# Patient Record
Sex: Female | Born: 1985 | Race: Black or African American | Hispanic: No | Marital: Single | Smoking: Never smoker
Health system: Southern US, Community
[De-identification: ages and names within clinical notes are randomized; demographics above are authoritative.]

## PROBLEM LIST (undated history)

## (undated) DIAGNOSIS — T300 Burn of unspecified body region, unspecified degree: Secondary | ICD-10-CM

## (undated) DIAGNOSIS — J45909 Unspecified asthma, uncomplicated: Secondary | ICD-10-CM

## (undated) DIAGNOSIS — G8929 Other chronic pain: Secondary | ICD-10-CM

## (undated) DIAGNOSIS — W3400XA Accidental discharge from unspecified firearms or gun, initial encounter: Secondary | ICD-10-CM

## (undated) HISTORY — PX: OTHER SURGICAL HISTORY: SHX169

---

## 2007-04-18 ENCOUNTER — Emergency Department (HOSPITAL_COMMUNITY): Admission: EM | Admit: 2007-04-18 | Discharge: 2007-04-18 | Payer: Self-pay | Admitting: Emergency Medicine

## 2014-05-14 ENCOUNTER — Emergency Department (HOSPITAL_COMMUNITY)
Admission: EM | Admit: 2014-05-14 | Discharge: 2014-05-15 | Disposition: A | Discharge status: Home / Self Care | Attending: Emergency Medicine | Admitting: Emergency Medicine

## 2014-05-14 ENCOUNTER — Encounter (HOSPITAL_COMMUNITY): Payer: Self-pay | Admitting: Emergency Medicine

## 2014-05-14 DIAGNOSIS — Z87828 Personal history of other (healed) physical injury and trauma: Secondary | ICD-10-CM | POA: Insufficient documentation

## 2014-05-14 DIAGNOSIS — A499 Bacterial infection, unspecified: Secondary | ICD-10-CM | POA: Insufficient documentation

## 2014-05-14 DIAGNOSIS — M545 Low back pain, unspecified: Secondary | ICD-10-CM | POA: Insufficient documentation

## 2014-05-14 DIAGNOSIS — Z791 Long term (current) use of non-steroidal anti-inflammatories (NSAID): Secondary | ICD-10-CM | POA: Insufficient documentation

## 2014-05-14 DIAGNOSIS — J45909 Unspecified asthma, uncomplicated: Secondary | ICD-10-CM | POA: Insufficient documentation

## 2014-05-14 DIAGNOSIS — N760 Acute vaginitis: Secondary | ICD-10-CM | POA: Insufficient documentation

## 2014-05-14 DIAGNOSIS — G8929 Other chronic pain: Secondary | ICD-10-CM | POA: Insufficient documentation

## 2014-05-14 DIAGNOSIS — B9689 Other specified bacterial agents as the cause of diseases classified elsewhere: Secondary | ICD-10-CM | POA: Insufficient documentation

## 2014-05-14 DIAGNOSIS — Z3202 Encounter for pregnancy test, result negative: Secondary | ICD-10-CM | POA: Insufficient documentation

## 2014-05-14 DIAGNOSIS — R1110 Vomiting, unspecified: Secondary | ICD-10-CM | POA: Insufficient documentation

## 2014-05-14 DIAGNOSIS — Z7982 Long term (current) use of aspirin: Secondary | ICD-10-CM | POA: Insufficient documentation

## 2014-05-14 HISTORY — DX: Burn of unspecified body region, unspecified degree: T30.0

## 2014-05-14 HISTORY — DX: Accidental discharge from unspecified firearms or gun, initial encounter: W34.00XA

## 2014-05-14 HISTORY — DX: Unspecified asthma, uncomplicated: J45.909

## 2014-05-14 HISTORY — DX: Other chronic pain: G89.29

## 2014-05-14 LAB — CBC WITH DIFFERENTIAL/PLATELET
BASOS ABS: 0 10*3/uL (ref 0.0–0.1)
Basophils Relative: 0 % (ref 0–1)
EOS PCT: 4 % (ref 0–5)
Eosinophils Absolute: 0.2 10*3/uL (ref 0.0–0.7)
HEMATOCRIT: 33.5 % — AB (ref 36.0–46.0)
HEMOGLOBIN: 11.3 g/dL — AB (ref 12.0–15.0)
LYMPHS ABS: 2.5 10*3/uL (ref 0.7–4.0)
LYMPHS PCT: 46 % (ref 12–46)
MCH: 28.6 pg (ref 26.0–34.0)
MCHC: 33.7 g/dL (ref 30.0–36.0)
MCV: 84.8 fL (ref 78.0–100.0)
MONO ABS: 0.3 10*3/uL (ref 0.1–1.0)
MONOS PCT: 6 % (ref 3–12)
Neutro Abs: 2.4 10*3/uL (ref 1.7–7.7)
Neutrophils Relative %: 44 % (ref 43–77)
Platelets: 250 10*3/uL (ref 150–400)
RBC: 3.95 MIL/uL (ref 3.87–5.11)
RDW: 14.1 % (ref 11.5–15.5)
WBC: 5.6 10*3/uL (ref 4.0–10.5)

## 2014-05-14 LAB — URINALYSIS, ROUTINE W REFLEX MICROSCOPIC
Bilirubin Urine: NEGATIVE
Glucose, UA: NEGATIVE mg/dL
Hgb urine dipstick: NEGATIVE
KETONES UR: NEGATIVE mg/dL
Nitrite: NEGATIVE
PROTEIN: NEGATIVE mg/dL
Specific Gravity, Urine: 1.016 (ref 1.005–1.030)
UROBILINOGEN UA: 1 mg/dL (ref 0.0–1.0)
pH: 6 (ref 5.0–8.0)

## 2014-05-14 LAB — COMPREHENSIVE METABOLIC PANEL
ALT: 12 U/L (ref 0–35)
AST: 18 U/L (ref 0–37)
Albumin: 3.4 g/dL — ABNORMAL LOW (ref 3.5–5.2)
Alkaline Phosphatase: 55 U/L (ref 39–117)
BUN: 8 mg/dL (ref 6–23)
CALCIUM: 8.6 mg/dL (ref 8.4–10.5)
CO2: 25 meq/L (ref 19–32)
CREATININE: 0.74 mg/dL (ref 0.50–1.10)
Chloride: 104 mEq/L (ref 96–112)
GLUCOSE: 112 mg/dL — AB (ref 70–99)
Potassium: 3.5 mEq/L — ABNORMAL LOW (ref 3.7–5.3)
Sodium: 140 mEq/L (ref 137–147)
Total Protein: 7.3 g/dL (ref 6.0–8.3)

## 2014-05-14 LAB — URINE MICROSCOPIC-ADD ON

## 2014-05-14 LAB — POC URINE PREG, ED: Preg Test, Ur: NEGATIVE

## 2014-05-14 MED ORDER — OXYCODONE-ACETAMINOPHEN 5-325 MG PO TABS
2.0000 | ORAL_TABLET | Freq: Once | ORAL | Status: AC
  Administered 2014-05-14: 2 via ORAL
  Filled 2014-05-14: qty 2

## 2014-05-14 MED ORDER — ONDANSETRON HCL 4 MG/2ML IJ SOLN
4.0000 mg | Freq: Once | INTRAMUSCULAR | Status: AC
  Administered 2014-05-14: 4 mg via INTRAVENOUS
  Filled 2014-05-14: qty 2

## 2014-05-14 NOTE — ED Notes (Signed)
POCT PREG RESULTED NEG.
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
## 2014-05-15 ENCOUNTER — Encounter (HOSPITAL_COMMUNITY): Payer: Self-pay | Admitting: Emergency Medicine

## 2014-05-15 LAB — WET PREP, GENITAL
TRICH WET PREP: NONE SEEN
Yeast Wet Prep HPF POC: NONE SEEN

## 2014-05-15 LAB — GC/CHLAMYDIA PROBE AMP
CT PROBE, AMP APTIMA: NEGATIVE
GC PROBE AMP APTIMA: NEGATIVE

## 2014-05-15 MED ORDER — ONDANSETRON HCL 4 MG PO TABS: 4.0000 mg | ORAL_TABLET | Freq: Four times a day (QID) | ORAL | Status: AC

## 2014-05-15 MED ORDER — METRONIDAZOLE 500 MG PO TABS
2000.0000 mg | ORAL_TABLET | Freq: Once | ORAL | Status: AC
  Administered 2014-05-15: 2000 mg via ORAL
  Filled 2014-05-15: qty 4

## 2014-05-15 MED ORDER — AZITHROMYCIN 250 MG PO TABS
1000.0000 mg | ORAL_TABLET | Freq: Once | ORAL | Status: AC
  Administered 2014-05-15: 1000 mg via ORAL
  Filled 2014-05-15: qty 4

## 2014-05-15 MED ORDER — CEFTRIAXONE SODIUM 250 MG IJ SOLR
250.0000 mg | Freq: Once | INTRAMUSCULAR | Status: AC
  Administered 2014-05-15: 250 mg via INTRAMUSCULAR
  Filled 2014-05-15: qty 250

## 2014-05-15 MED ORDER — NAPROXEN 375 MG PO TABS: 375.0000 mg | ORAL_TABLET | Freq: Two times a day (BID) | ORAL | Status: AC

## 2014-05-15 MED ORDER — CYCLOBENZAPRINE HCL 10 MG PO TABS: 10.0000 mg | ORAL_TABLET | Freq: Two times a day (BID) | ORAL | Status: AC | PRN

## 2014-05-15 MED ORDER — ONDANSETRON 4 MG PO TBDP
4.0000 mg | ORAL_TABLET | Freq: Once | ORAL | Status: AC
  Administered 2014-05-15: 4 mg via ORAL
  Filled 2014-05-15: qty 1

## 2014-05-15 NOTE — Discharge Instructions (Signed)

## 2014-05-17 NOTE — ED Provider Notes (Signed)
Medical screening examination/treatment/procedure(s) were conducted as a shared visit with non-physician practitioner(s) and myself.  I personally evaluated the patient during the encounter.   Pt c/o low back pain. Constant. Dull. Non radiating. Spine nt. No cva tenderness.   Suzi Roots, MD 05/17/14 [phone]

## 2017-08-28 ENCOUNTER — Emergency Department (HOSPITAL_COMMUNITY)
Admission: EM | Admit: 2017-08-28 | Discharge: 2017-08-28 | Disposition: A | Discharge status: Home / Self Care | Attending: Emergency Medicine | Admitting: Emergency Medicine

## 2017-08-28 DIAGNOSIS — Z79899 Other long term (current) drug therapy: Secondary | ICD-10-CM | POA: Insufficient documentation

## 2017-08-28 DIAGNOSIS — Z791 Long term (current) use of non-steroidal anti-inflammatories (NSAID): Secondary | ICD-10-CM | POA: Insufficient documentation

## 2017-08-28 DIAGNOSIS — J45909 Unspecified asthma, uncomplicated: Secondary | ICD-10-CM | POA: Insufficient documentation

## 2017-08-28 DIAGNOSIS — R110 Nausea: Secondary | ICD-10-CM

## 2017-08-28 DIAGNOSIS — R112 Nausea with vomiting, unspecified: Secondary | ICD-10-CM | POA: Insufficient documentation

## 2017-08-28 LAB — COMPREHENSIVE METABOLIC PANEL
ALK PHOS: 43 U/L (ref 38–126)
ALT: 14 U/L (ref 14–54)
AST: 17 U/L (ref 15–41)
Albumin: 3.5 g/dL (ref 3.5–5.0)
Anion gap: 4 — ABNORMAL LOW (ref 5–15)
BUN: 6 mg/dL (ref 6–20)
CALCIUM: 8.7 mg/dL — AB (ref 8.9–10.3)
CHLORIDE: 108 mmol/L (ref 101–111)
CO2: 26 mmol/L (ref 22–32)
CREATININE: 0.92 mg/dL (ref 0.44–1.00)
Glucose, Bld: 99 mg/dL (ref 65–99)
Potassium: 4.1 mmol/L (ref 3.5–5.1)
SODIUM: 138 mmol/L (ref 135–145)
Total Bilirubin: 0.4 mg/dL (ref 0.3–1.2)
Total Protein: 7 g/dL (ref 6.5–8.1)

## 2017-08-28 LAB — CBC
HCT: 32.2 % — ABNORMAL LOW (ref 36.0–46.0)
Hemoglobin: 10.7 g/dL — ABNORMAL LOW (ref 12.0–15.0)
MCH: 28.5 pg (ref 26.0–34.0)
MCHC: 33.2 g/dL (ref 30.0–36.0)
MCV: 85.6 fL (ref 78.0–100.0)
PLATELETS: 223 10*3/uL (ref 150–400)
RBC: 3.76 MIL/uL — AB (ref 3.87–5.11)
RDW: 15.1 % (ref 11.5–15.5)
WBC: 4 10*3/uL (ref 4.0–10.5)

## 2017-08-28 LAB — I-STAT BETA HCG BLOOD, ED (MC, WL, AP ONLY): I-stat hCG, quantitative: <5 m[IU]/mL (ref <5)

## 2017-08-28 LAB — URINALYSIS, ROUTINE W REFLEX MICROSCOPIC
BILIRUBIN URINE: NEGATIVE
GLUCOSE, UA: NEGATIVE mg/dL
Hgb urine dipstick: NEGATIVE
KETONES UR: NEGATIVE mg/dL
Leukocytes, UA: NEGATIVE
Nitrite: NEGATIVE
PH: 6 (ref 5.0–8.0)
Protein, ur: NEGATIVE mg/dL
Specific Gravity, Urine: 1.016 (ref 1.005–1.030)

## 2017-08-28 LAB — LIPASE, BLOOD: Lipase: 22 U/L (ref 11–51)

## 2017-08-28 MED ORDER — ONDANSETRON 4 MG PO TBDP: ORAL_TABLET | ORAL | 0 refills | Status: AC
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
## 2017-08-28 NOTE — ED Notes (Signed)
ED Provider at bedside. 

## 2017-08-28 NOTE — ED Triage Notes (Addendum)
Pt states 3 weeks of emesis every morning and every night. States period was 2 weeks late, but states it started yesterday but it is more of a spotting. States occasional abdominal pain. Last pregnancy 12 years ago. Pt not currently on birth control.

## 2017-12-31 ENCOUNTER — Emergency Department (HOSPITAL_COMMUNITY)
Admission: EM | Admit: 2017-12-31 | Discharge: 2017-12-31 | Disposition: A | Discharge status: Home / Self Care | Attending: Emergency Medicine | Admitting: Emergency Medicine

## 2017-12-31 ENCOUNTER — Encounter (HOSPITAL_COMMUNITY): Payer: Self-pay | Admitting: Emergency Medicine

## 2017-12-31 DIAGNOSIS — R51 Headache: Secondary | ICD-10-CM | POA: Insufficient documentation

## 2017-12-31 DIAGNOSIS — Z5321 Procedure and treatment not carried out due to patient leaving prior to being seen by health care provider: Secondary | ICD-10-CM | POA: Insufficient documentation

## 2017-12-31 NOTE — ED Notes (Signed)
Pt called for recheck v/s, no response from lobby 

## 2017-12-31 NOTE — ED Notes (Signed)
Called  No response from lobby 

## 2017-12-31 NOTE — ED Notes (Signed)
Pt called  No response from lobby  
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
## 2018-07-18 ENCOUNTER — Ambulatory Visit (HOSPITAL_COMMUNITY)
Admission: EM | Admit: 2018-07-18 | Discharge: 2018-07-18 | Disposition: A | Discharge status: Home / Self Care | Payer: Self-pay
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
## 2019-09-25 ENCOUNTER — Encounter (HOSPITAL_COMMUNITY): Payer: Self-pay

## 2019-09-25 ENCOUNTER — Emergency Department (HOSPITAL_COMMUNITY)
Admission: EM | Admit: 2019-09-25 | Discharge: 2019-09-25 | Disposition: A | Discharge status: Home / Self Care | Payer: Self-pay | Attending: Emergency Medicine | Admitting: Emergency Medicine

## 2019-09-25 ENCOUNTER — Other Ambulatory Visit: Payer: Self-pay

## 2019-09-25 DIAGNOSIS — J45909 Unspecified asthma, uncomplicated: Secondary | ICD-10-CM | POA: Insufficient documentation

## 2019-09-25 DIAGNOSIS — M7918 Myalgia, other site: Secondary | ICD-10-CM | POA: Insufficient documentation

## 2019-09-25 DIAGNOSIS — K0889 Other specified disorders of teeth and supporting structures: Secondary | ICD-10-CM | POA: Insufficient documentation

## 2019-09-25 DIAGNOSIS — Z79899 Other long term (current) drug therapy: Secondary | ICD-10-CM | POA: Insufficient documentation

## 2019-09-25 MED ORDER — AMOXICILLIN-POT CLAVULANATE 875-125 MG PO TABS: 1.0000 | ORAL_TABLET | Freq: Two times a day (BID) | ORAL | 0 refills | Status: AC

## 2019-09-25 MED ORDER — METHOCARBAMOL 750 MG PO TABS: 750.0000 mg | ORAL_TABLET | Freq: Every evening | ORAL | 0 refills | Status: AC | PRN

## 2019-09-25 NOTE — ED Triage Notes (Signed)
Pt presents with c/o dental pain. Pt reports the pain in her tooth is causing her jaw to hurt and also reports her left shoulder and back are hurting as well and unsure of this is related.

## 2019-09-25 NOTE — Discharge Instructions (Addendum)
You were given a prescription for antibiotics. Please take the antibiotic prescription fully.   You may alternate taking Tylenol and Ibuprofen as needed for pain control. You may take 400-600 mg of ibuprofen every 6 hours and [phone] mg of Tylenol every 6 hours. Do not exceed 4000 mg of Tylenol daily as this can lead to liver damage. Also, make sure to take Ibuprofen with meals as it can cause an upset stomach. Do not take other NSAIDs while taking Ibuprofen such as (Aleve, Naprosyn, Aspirin, Celebrex, etc) and do not take more than the prescribed dose as this can lead to ulcers and bleeding in your GI tract. You may use warm and cold compresses to help with your symptoms.   You were given a prescription for Robaxin which is a muscle relaxer.  You should not drive, work, or operate machinery while taking this medication as it can make you very drowsy.    Please follow up with your primary doctor within the next 7-10 days for re-evaluation and further treatment of your symptoms.   Please return to the ER sooner if you have any new or worsening symptoms.
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
## 2019-12-14 ENCOUNTER — Other Ambulatory Visit: Payer: Self-pay

## 2019-12-14 ENCOUNTER — Emergency Department (HOSPITAL_COMMUNITY)
Admission: EM | Admit: 2019-12-14 | Discharge: 2019-12-14 | Disposition: A | Discharge status: Home / Self Care | Payer: Self-pay | Attending: Emergency Medicine | Admitting: Emergency Medicine

## 2019-12-14 ENCOUNTER — Encounter (HOSPITAL_COMMUNITY): Payer: Self-pay

## 2019-12-14 DIAGNOSIS — R5383 Other fatigue: Secondary | ICD-10-CM | POA: Insufficient documentation

## 2019-12-14 DIAGNOSIS — J45909 Unspecified asthma, uncomplicated: Secondary | ICD-10-CM | POA: Insufficient documentation

## 2019-12-14 DIAGNOSIS — R079 Chest pain, unspecified: Secondary | ICD-10-CM | POA: Insufficient documentation

## 2019-12-14 DIAGNOSIS — M7918 Myalgia, other site: Secondary | ICD-10-CM | POA: Insufficient documentation

## 2019-12-14 DIAGNOSIS — U071 COVID-19: Secondary | ICD-10-CM

## 2019-12-14 DIAGNOSIS — Z20828 Contact with and (suspected) exposure to other viral communicable diseases: Secondary | ICD-10-CM | POA: Insufficient documentation

## 2019-12-14 DIAGNOSIS — R0602 Shortness of breath: Secondary | ICD-10-CM | POA: Insufficient documentation

## 2019-12-14 LAB — POC SARS CORONAVIRUS 2 AG -  ED: SARS Coronavirus 2 Ag: NEGATIVE
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
## 2019-12-14 NOTE — ED Triage Notes (Signed)
Pt reports she was exposed to [place] a few days ago. Pt states she started having generalized body aches and loss of taste last night. Pt reports taking Tylenol about an hour and a half ago for pain.

## 2019-12-14 NOTE — Discharge Instructions (Addendum)
Your COVID-19 test was negative today.  Please be aware this test is not 100% accurate.  You may have retesting performed via the drive-through within the next week.  Please know the symptoms and signs that you are having are consistent with COVID-19 infection, you will need to quarantine for the next 14 days.  Continue to take Tylenol for your symptoms.  If you experience any shortness of breath, chest pain, worsening symptoms please return to the ER.

## 2020-01-23 ENCOUNTER — Emergency Department (HOSPITAL_COMMUNITY): Admission: EM | Admit: 2020-01-23 | Discharge: 2020-01-23 | Discharge status: Absent without leave | Payer: Self-pay

## 2020-01-23 ENCOUNTER — Other Ambulatory Visit: Payer: Self-pay

## 2020-01-23 NOTE — ED Notes (Signed)
Called x3 for triage with no answer 

## 2020-10-14 ENCOUNTER — Other Ambulatory Visit: Payer: Self-pay

## 2020-10-14 ENCOUNTER — Emergency Department (HOSPITAL_COMMUNITY): Payer: Self-pay

## 2020-10-14 ENCOUNTER — Emergency Department (HOSPITAL_COMMUNITY)
Admission: EM | Admit: 2020-10-14 | Discharge: 2020-10-15 | Disposition: A | Discharge status: Home / Self Care | Payer: Self-pay | Attending: Emergency Medicine | Admitting: Emergency Medicine

## 2020-10-14 ENCOUNTER — Encounter (HOSPITAL_COMMUNITY): Payer: Self-pay | Admitting: Emergency Medicine

## 2020-10-14 DIAGNOSIS — J189 Pneumonia, unspecified organism: Secondary | ICD-10-CM

## 2020-10-14 DIAGNOSIS — Z20822 Contact with and (suspected) exposure to covid-19: Secondary | ICD-10-CM | POA: Insufficient documentation

## 2020-10-14 DIAGNOSIS — J181 Lobar pneumonia, unspecified organism: Secondary | ICD-10-CM | POA: Insufficient documentation

## 2020-10-14 DIAGNOSIS — R112 Nausea with vomiting, unspecified: Secondary | ICD-10-CM | POA: Insufficient documentation

## 2020-10-14 DIAGNOSIS — J45909 Unspecified asthma, uncomplicated: Secondary | ICD-10-CM | POA: Insufficient documentation

## 2020-10-14 DIAGNOSIS — R000 Tachycardia, unspecified: Secondary | ICD-10-CM | POA: Insufficient documentation

## 2020-10-14 LAB — CBC
HCT: 36.6 % (ref 36.0–46.0)
Hemoglobin: 12.6 g/dL (ref 12.0–15.0)
MCH: 30.3 pg (ref 26.0–34.0)
MCHC: 34.4 g/dL (ref 30.0–36.0)
MCV: 88 fL (ref 80.0–100.0)
Platelets: 229 10*3/uL (ref 150–400)
RBC: 4.16 MIL/uL (ref 3.87–5.11)
RDW: 12.8 % (ref 11.5–15.5)
WBC: 7.2 10*3/uL (ref 4.0–10.5)
nRBC: 0 % (ref 0.0–0.2)

## 2020-10-14 LAB — RESPIRATORY PANEL BY RT PCR (FLU A&B, COVID)
Influenza A by PCR: NEGATIVE
Influenza B by PCR: NEGATIVE
SARS Coronavirus 2 by RT PCR: NEGATIVE

## 2020-10-14 LAB — COMPREHENSIVE METABOLIC PANEL
ALT: 15 U/L (ref 0–44)
AST: 19 U/L (ref 15–41)
Albumin: 4.1 g/dL (ref 3.5–5.0)
Alkaline Phosphatase: 48 U/L (ref 38–126)
Anion gap: 13 (ref 5–15)
BUN: 6 mg/dL (ref 6–20)
CO2: 23 mmol/L (ref 22–32)
Calcium: 9.6 mg/dL (ref 8.9–10.3)
Chloride: 109 mmol/L (ref 98–111)
Creatinine, Ser: 0.86 mg/dL (ref 0.44–1.00)
GFR, Estimated: >60 mL/min (ref >60)
Glucose, Bld: 111 mg/dL — ABNORMAL HIGH (ref 70–99)
Potassium: 3.4 mmol/L — ABNORMAL LOW (ref 3.5–5.1)
Sodium: 145 mmol/L (ref 135–145)
Total Bilirubin: 0.9 mg/dL (ref 0.3–1.2)
Total Protein: 8.2 g/dL — ABNORMAL HIGH (ref 6.5–8.1)

## 2020-10-14 LAB — URINALYSIS, ROUTINE W REFLEX MICROSCOPIC
Bilirubin Urine: NEGATIVE
Glucose, UA: NEGATIVE mg/dL
Hgb urine dipstick: NEGATIVE
Ketones, ur: 20 mg/dL — AB
Leukocytes,Ua: NEGATIVE
Nitrite: NEGATIVE
Protein, ur: NEGATIVE mg/dL
Specific Gravity, Urine: 1.016 (ref 1.005–1.030)
pH: 5 (ref 5.0–8.0)

## 2020-10-14 LAB — I-STAT BETA HCG BLOOD, ED (MC, WL, AP ONLY): I-stat hCG, quantitative: <5 m[IU]/mL (ref <5)

## 2020-10-14 LAB — LIPASE, BLOOD: Lipase: 19 U/L (ref 11–51)

## 2020-10-14 LAB — CBG MONITORING, ED: Glucose-Capillary: 107 mg/dL — ABNORMAL HIGH (ref 70–99)

## 2020-10-14 MED ORDER — DOXYCYCLINE HYCLATE 100 MG PO TABS
100.0000 mg | ORAL_TABLET | Freq: Once | ORAL | Status: AC
  Administered 2020-10-14: 100 mg via ORAL
  Filled 2020-10-14: qty 1

## 2020-10-14 MED ORDER — METHYLPREDNISOLONE SODIUM SUCC 125 MG IJ SOLR
125.0000 mg | Freq: Once | INTRAMUSCULAR | Status: AC
  Administered 2020-10-14: 125 mg via INTRAVENOUS
  Filled 2020-10-14: qty 2

## 2020-10-14 MED ORDER — PREDNISONE 10 MG PO TABS: 40.0000 mg | ORAL_TABLET | Freq: Every day | ORAL | 0 refills | Status: AC

## 2020-10-14 MED ORDER — IPRATROPIUM-ALBUTEROL 0.5-2.5 (3) MG/3ML IN SOLN
3.0000 mL | Freq: Once | RESPIRATORY_TRACT | Status: AC
  Administered 2020-10-14: 3 mL via RESPIRATORY_TRACT
  Filled 2020-10-14: qty 3

## 2020-10-14 MED ORDER — BENZONATATE 100 MG PO CAPS
100.0000 mg | ORAL_CAPSULE | Freq: Once | ORAL | Status: AC
  Administered 2020-10-14: 100 mg via ORAL
  Filled 2020-10-14: qty 1

## 2020-10-14 MED ORDER — ALBUTEROL SULFATE HFA 108 (90 BASE) MCG/ACT IN AERS
4.0000 | INHALATION_SPRAY | Freq: Once | RESPIRATORY_TRACT | Status: AC
  Administered 2020-10-14: 4 via RESPIRATORY_TRACT
  Filled 2020-10-14: qty 6.7

## 2020-10-14 MED ORDER — IPRATROPIUM-ALBUTEROL 0.5-2.5 (3) MG/3ML IN SOLN
RESPIRATORY_TRACT | Status: AC
  Filled 2020-10-14: qty 3

## 2020-10-14 MED ORDER — ONDANSETRON HCL 4 MG/2ML IJ SOLN
4.0000 mg | Freq: Once | INTRAMUSCULAR | Status: AC
  Administered 2020-10-14: 4 mg via INTRAVENOUS
  Filled 2020-10-14: qty 2

## 2020-10-14 MED ORDER — SODIUM CHLORIDE 0.9 % IV BOLUS
1000.0000 mL | Freq: Once | INTRAVENOUS | Status: AC
Start: 2020-10-14 — End: 2020-10-14
  Administered 2020-10-14: 1000 mL via INTRAVENOUS

## 2020-10-14 MED ORDER — DOXYCYCLINE HYCLATE 100 MG PO CAPS: 100.0000 mg | ORAL_CAPSULE | Freq: Two times a day (BID) | ORAL | 0 refills | Status: DC

## 2020-10-14 MED ORDER — ALBUTEROL SULFATE HFA 108 (90 BASE) MCG/ACT IN AERS: 2.0000 | INHALATION_SPRAY | RESPIRATORY_TRACT | 0 refills | Status: AC | PRN

## 2020-10-14 MED ORDER — ALBUTEROL SULFATE HFA 108 (90 BASE) MCG/ACT IN AERS: 4.0000 | INHALATION_SPRAY | Freq: Once | RESPIRATORY_TRACT | Status: DC

## 2020-10-14 NOTE — ED Notes (Signed)
Pt states that she needs to leave right now because she has not had anything to eat all day

## 2020-10-14 NOTE — ED Triage Notes (Signed)
Pt reports cough and congestion starting last night. Also reports vomiting all day. States that any smells make her nauseous. Not vaccinated against COVID 19 and no hx of it.

## 2020-10-14 NOTE — ED Notes (Signed)
Pt able to ambulate without assitance and with no complaints. Pt O2 saturation remained above 94%
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
## 2020-10-14 NOTE — ED Notes (Signed)
Sent extra blue tube to lab 

## 2021-01-03 ENCOUNTER — Other Ambulatory Visit: Payer: Self-pay

## 2021-01-03 ENCOUNTER — Encounter (HOSPITAL_COMMUNITY): Payer: Self-pay

## 2021-01-03 ENCOUNTER — Emergency Department (HOSPITAL_COMMUNITY): Payer: Self-pay

## 2021-01-03 ENCOUNTER — Emergency Department (HOSPITAL_COMMUNITY)
Admission: EM | Admit: 2021-01-03 | Discharge: 2021-01-03 | Disposition: A | Discharge status: Home / Self Care | Payer: Self-pay | Attending: Emergency Medicine | Admitting: Emergency Medicine

## 2021-01-03 DIAGNOSIS — R0602 Shortness of breath: Secondary | ICD-10-CM | POA: Insufficient documentation

## 2021-01-03 DIAGNOSIS — J45909 Unspecified asthma, uncomplicated: Secondary | ICD-10-CM | POA: Insufficient documentation

## 2021-01-03 DIAGNOSIS — Z5321 Procedure and treatment not carried out due to patient leaving prior to being seen by health care provider: Secondary | ICD-10-CM | POA: Insufficient documentation

## 2021-01-03 NOTE — ED Triage Notes (Signed)
Pt reports increasing SHOB x3-4 days. Pt denies being vaccinated for COVID or being exposed to anyone with COVID. Pt has hx of asthma, but reports not using her inhaler because she did not feel like she needed it.

## 2021-01-09 ENCOUNTER — Encounter (HOSPITAL_COMMUNITY): Payer: Self-pay | Admitting: Emergency Medicine

## 2021-01-09 ENCOUNTER — Other Ambulatory Visit: Payer: Self-pay

## 2021-01-09 ENCOUNTER — Emergency Department (HOSPITAL_COMMUNITY)
Admission: EM | Admit: 2021-01-09 | Discharge: 2021-01-09 | Disposition: A | Discharge status: Home / Self Care | Payer: Self-pay | Attending: Emergency Medicine | Admitting: Emergency Medicine

## 2021-01-09 DIAGNOSIS — K0889 Other specified disorders of teeth and supporting structures: Secondary | ICD-10-CM | POA: Insufficient documentation

## 2021-01-09 DIAGNOSIS — Z5321 Procedure and treatment not carried out due to patient leaving prior to being seen by health care provider: Secondary | ICD-10-CM | POA: Insufficient documentation

## 2021-01-09 NOTE — ED Notes (Signed)
Pt got very upset about the wait time and left
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
## 2021-07-01 ENCOUNTER — Encounter (HOSPITAL_COMMUNITY): Payer: Self-pay

## 2021-07-01 ENCOUNTER — Other Ambulatory Visit: Payer: Self-pay

## 2021-07-01 ENCOUNTER — Emergency Department (HOSPITAL_COMMUNITY)
Admission: EM | Admit: 2021-07-01 | Discharge: 2021-07-01 | Disposition: A | Discharge status: Home / Self Care | Payer: Self-pay | Attending: Emergency Medicine | Admitting: Emergency Medicine

## 2021-07-01 DIAGNOSIS — Z202 Contact with and (suspected) exposure to infections with a predominantly sexual mode of transmission: Secondary | ICD-10-CM | POA: Insufficient documentation

## 2021-07-01 DIAGNOSIS — J45909 Unspecified asthma, uncomplicated: Secondary | ICD-10-CM | POA: Insufficient documentation

## 2021-07-01 LAB — I-STAT BETA HCG BLOOD, ED (MC, WL, AP ONLY): I-stat hCG, quantitative: <5 m[IU]/mL (ref <5)

## 2021-07-01 LAB — URINALYSIS, ROUTINE W REFLEX MICROSCOPIC
Bacteria, UA: NONE SEEN
Bilirubin Urine: NEGATIVE
Glucose, UA: NEGATIVE mg/dL
Ketones, ur: NEGATIVE mg/dL
Leukocytes,Ua: NEGATIVE
Nitrite: NEGATIVE
Protein, ur: NEGATIVE mg/dL
Specific Gravity, Urine: 1.013 (ref 1.005–1.030)
pH: 6 (ref 5.0–8.0)

## 2021-07-01 LAB — WET PREP, GENITAL
Sperm: NONE SEEN
Trich, Wet Prep: NONE SEEN
WBC, Wet Prep HPF POC: NONE SEEN
Yeast Wet Prep HPF POC: NONE SEEN

## 2021-07-01 LAB — RAPID HIV SCREEN (HIV 1/2 AB+AG)
HIV 1/2 Antibodies: NONREACTIVE
HIV-1 P24 Antigen - HIV24: NONREACTIVE

## 2021-07-01 NOTE — Discharge Instructions (Addendum)
Your work-up today is reassuring so far.  Rest of your labs will return over the next day or 2.  Please follow-up with your primary care provider.  I have also given you the information for the [facility] department public health for future STD testing needs. You may always return to the ER for any new or concerning symptoms.
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
## 2021-07-02 LAB — GC/CHLAMYDIA PROBE AMP (~~LOC~~) NOT AT ARMC
Chlamydia: NEGATIVE
Comment: NEGATIVE
Comment: NORMAL
Neisseria Gonorrhea: NEGATIVE

## 2021-07-02 LAB — RPR: RPR Ser Ql: NONREACTIVE

## 2021-07-02 LAB — HIV ANTIBODY (ROUTINE TESTING W REFLEX): HIV Screen 4th Generation wRfx: NONREACTIVE

## 2021-07-04 IMAGING — DX DG CHEST 1V PORT
1 series · 1 of 1 positions shown · non-contrast
Comparison: None.

CLINICAL DATA: Cough, shortness of breath

EXAM:
PORTABLE CHEST 1 VIEW

[chest ap]
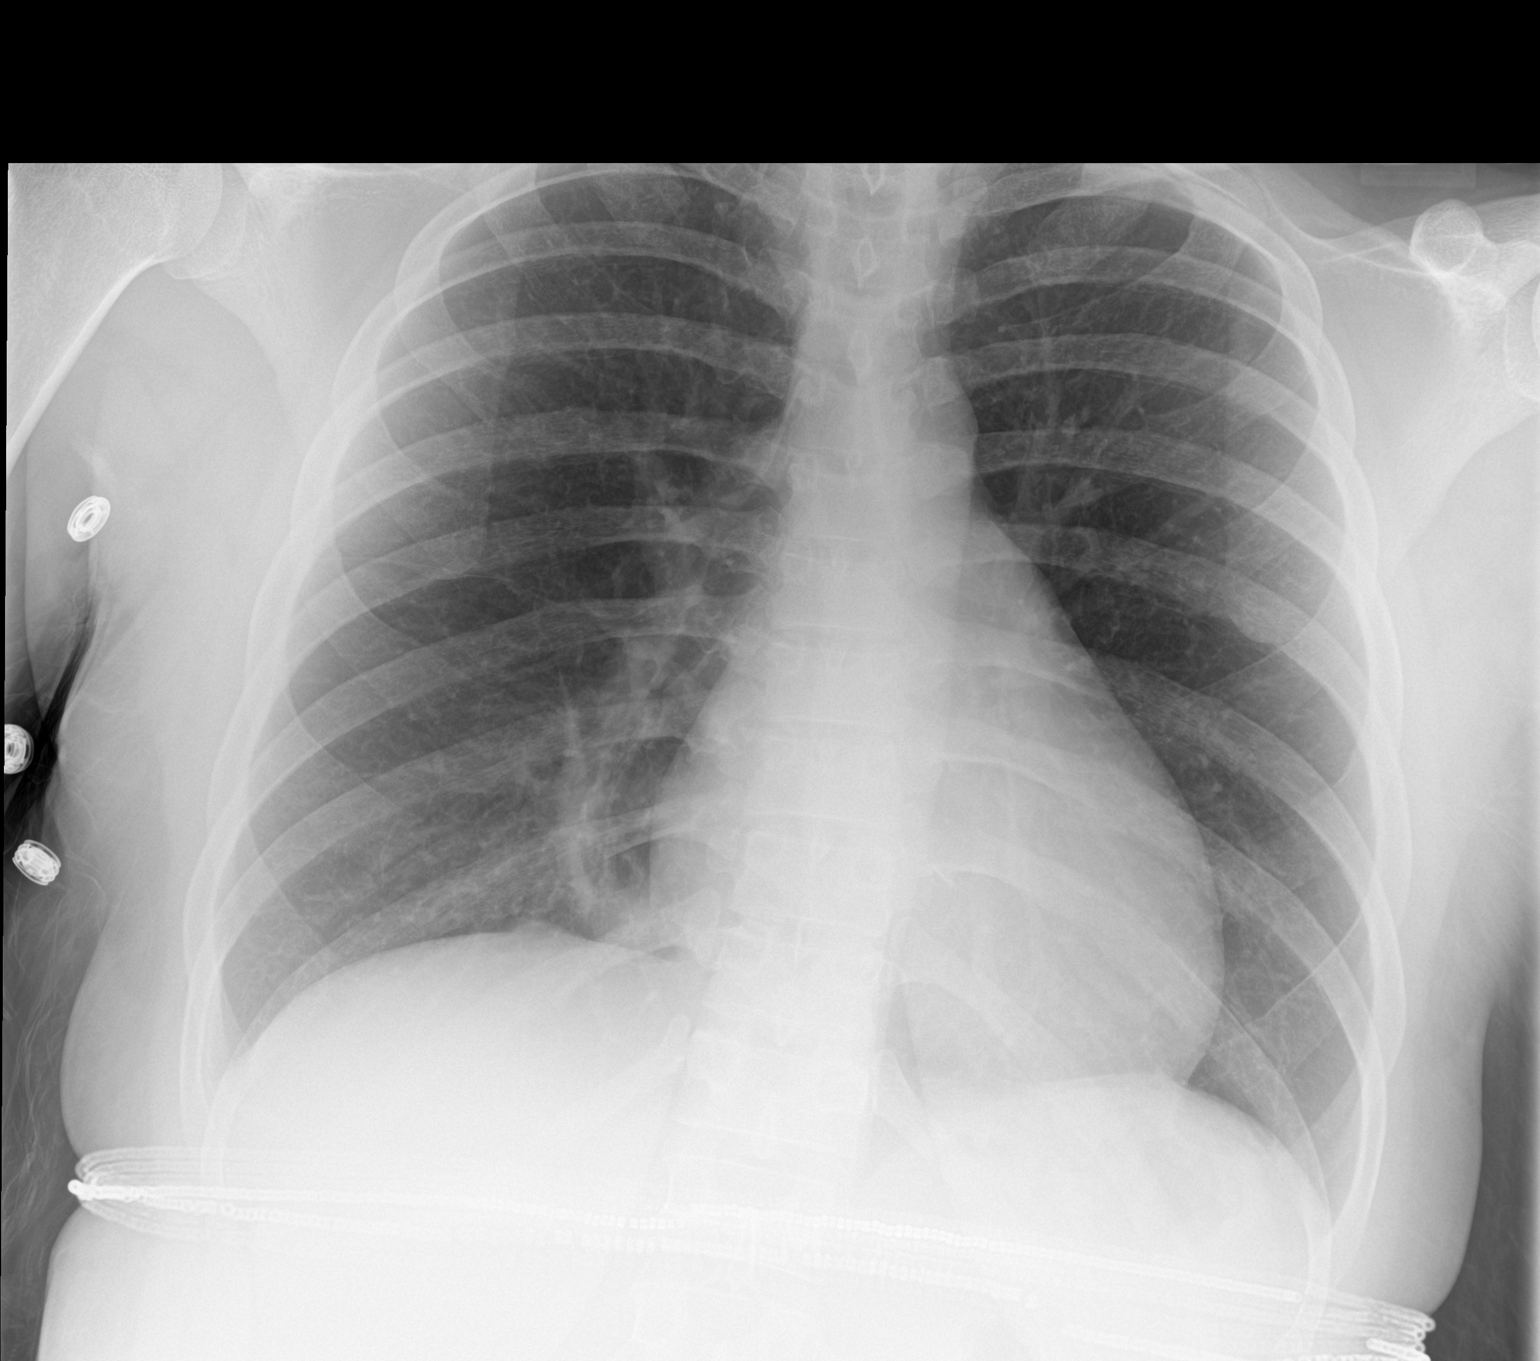

[1 of 1 positions shown; findings below may reference images not displayed]

FINDINGS: Patchy opacity medially at the right lung base. Left lung clear.
Heart is normal size. No effusions or acute bony abnormality.
IMPRESSION: Right medial basilar airspace opacity concerning for pneumonia.

## 2023-02-01 ENCOUNTER — Encounter (HOSPITAL_COMMUNITY): Payer: Self-pay | Admitting: Emergency Medicine

## 2023-02-01 ENCOUNTER — Other Ambulatory Visit: Payer: Self-pay

## 2023-02-01 ENCOUNTER — Emergency Department (HOSPITAL_COMMUNITY)
Admission: EM | Admit: 2023-02-01 | Discharge: 2023-02-01 | Disposition: A | Discharge status: Home / Self Care | Payer: Self-pay | Attending: Emergency Medicine | Admitting: Emergency Medicine

## 2023-02-01 DIAGNOSIS — N61 Mastitis without abscess: Secondary | ICD-10-CM | POA: Insufficient documentation

## 2023-02-01 DIAGNOSIS — D649 Anemia, unspecified: Secondary | ICD-10-CM | POA: Insufficient documentation

## 2023-02-01 LAB — CBC WITH DIFFERENTIAL/PLATELET
Abs Immature Granulocytes: 0.02 10*3/uL (ref 0.00–0.07)
Basophils Absolute: 0 10*3/uL (ref 0.0–0.1)
Basophils Relative: 1 %
Eosinophils Absolute: 0.2 10*3/uL (ref 0.0–0.5)
Eosinophils Relative: 4 %
HCT: 34 % — ABNORMAL LOW (ref 36.0–46.0)
Hemoglobin: 11.2 g/dL — ABNORMAL LOW (ref 12.0–15.0)
Immature Granulocytes: 0 %
Lymphocytes Relative: 25 %
Lymphs Abs: 1.7 10*3/uL (ref 0.7–4.0)
MCH: 29.4 pg (ref 26.0–34.0)
MCHC: 32.9 g/dL (ref 30.0–36.0)
MCV: 89.2 fL (ref 80.0–100.0)
Monocytes Absolute: 0.5 10*3/uL (ref 0.1–1.0)
Monocytes Relative: 8 %
Neutro Abs: 4.1 10*3/uL (ref 1.7–7.7)
Neutrophils Relative %: 62 %
Platelets: 203 10*3/uL (ref 150–400)
RBC: 3.81 MIL/uL — ABNORMAL LOW (ref 3.87–5.11)
RDW: 13.4 % (ref 11.5–15.5)
WBC: 6.7 10*3/uL (ref 4.0–10.5)
nRBC: 0 % (ref 0.0–0.2)

## 2023-02-01 LAB — BASIC METABOLIC PANEL
Anion gap: 7 (ref 5–15)
BUN: 7 mg/dL (ref 6–20)
CO2: 24 mmol/L (ref 22–32)
Calcium: 8.5 mg/dL — ABNORMAL LOW (ref 8.9–10.3)
Chloride: 107 mmol/L (ref 98–111)
Creatinine, Ser: 0.93 mg/dL (ref 0.44–1.00)
GFR, Estimated: >60 mL/min (ref >60)
Glucose, Bld: 104 mg/dL — ABNORMAL HIGH (ref 70–99)
Potassium: 3.9 mmol/L (ref 3.5–5.1)
Sodium: 138 mmol/L (ref 135–145)

## 2023-02-01 MED ORDER — CEPHALEXIN 500 MG PO CAPS
500.0000 mg | ORAL_CAPSULE | Freq: Once | ORAL | Status: AC
  Administered 2023-02-01: 500 mg via ORAL
  Filled 2023-02-01: qty 1

## 2023-02-01 MED ORDER — CEPHALEXIN 500 MG PO CAPS: 500.0000 mg | ORAL_CAPSULE | Freq: Four times a day (QID) | ORAL | 0 refills | Status: AC

## 2023-02-01 MED ORDER — SULFAMETHOXAZOLE-TRIMETHOPRIM 800-160 MG PO TABS
1.0000 | ORAL_TABLET | Freq: Once | ORAL | Status: AC
  Administered 2023-02-01: 1 via ORAL
  Filled 2023-02-01: qty 1

## 2023-02-01 MED ORDER — OXYCODONE-ACETAMINOPHEN 5-325 MG PO TABS: 1.0000 | ORAL_TABLET | Freq: Four times a day (QID) | ORAL | 0 refills | Status: AC | PRN

## 2023-02-01 MED ORDER — OXYCODONE-ACETAMINOPHEN 5-325 MG PO TABS
1.0000 | ORAL_TABLET | Freq: Once | ORAL | Status: AC
  Administered 2023-02-01: 1 via ORAL
  Filled 2023-02-01: qty 1

## 2023-02-01 MED ORDER — SULFAMETHOXAZOLE-TRIMETHOPRIM 800-160 MG PO TABS: 1.0000 | ORAL_TABLET | Freq: Two times a day (BID) | ORAL | 0 refills | Status: AC

## 2023-02-01 MED ORDER — DOXYCYCLINE HYCLATE 100 MG PO TABS
100.0000 mg | ORAL_TABLET | Freq: Once | ORAL | Status: DC
  Filled 2023-02-01: qty 1

## 2023-02-01 NOTE — ED Provider Notes (Signed)
[place] EMERGENCY DEPARTMENT AT [facility] Provider Note   CSN: [id number] Arrival date & time: 02/01/23  0930     History  Chief Complaint  Patient presents with   Breast Problem    Right    Tami Evans is a 37 y.o. female.  Patient presents to the emergency department for evaluation of right breast tenderness, redness and warmth over the past 4 days.  Patient has been recently evaluated by family medicine and by surgery for chronic intermittent eversion of her right nipple.  She has a history of mastitis.  She states that most recently she was recommended to get a breast reduction.  She states that over the past 4 days she has had increasing pain and swelling.  She states it feels like "a fever in my breast".  No nipple discharge or drainage.  The nipple inversion has been more persistent.  She just does not feel well.  Denies documented temperature or shaking chills.  No vomiting.  No treatments prior to arrival.  Mammogram after family practice visit and December as documented below without signs of malignancy.  11/2022: FINDINGS: Mammogram: There are no dominant masses, suspicious  calcifications, or areas of architectural distortion bilaterally. The  tissue pattern is symmetric.   Ultrasound: Normal tissue only is present within the retroareolar right  breast. There is no evidence of suspicious mass or shadowing.         Home Medications Prior to Admission medications   Medication Sig Start Date End Date Taking? Authorizing Provider  albuterol (PROVENTIL HFA;VENTOLIN HFA) 108 (90 BASE) MCG/ACT inhaler Inhale 2 puffs into the lungs every 6 (six) hours as needed for wheezing or shortness of breath.    [provider]  albuterol (VENTOLIN HFA) 108 (90 Base) MCG/ACT inhaler Inhale 2 puffs into the lungs every 4 (four) hours as needed for wheezing or shortness of breath. 10/14/20   Quintella Reichert, MD  cyclobenzaprine (FLEXERIL) 10 MG tablet Take 1  tablet (10 mg total) by mouth 2 (two) times daily as needed for muscle spasms. Patient not taking: Reported on 10/14/2020 05/15/14   Delos Haring, PA-C  doxycycline (VIBRAMYCIN) 100 MG capsule Take 1 capsule (100 mg total) by mouth 2 (two) times daily. 10/14/20   Quintella Reichert, MD  gabapentin (NEURONTIN) 300 MG capsule Take 300 mg by mouth at bedtime as needed (pain). Patient not taking: Reported on 10/14/2020    [provider]  ibuprofen (ADVIL,MOTRIN) 800 MG tablet Take 800 mg by mouth every 8 (eight) hours as needed for moderate pain.    [provider]  Naproxen (NAPROSYN PO) Take 1 tablet by mouth daily as needed. Pain.. The pt states got from the hospital last time she was there. This is a old medication and she did not rec from our hospital.    [provider]  naproxen (NAPROSYN) 375 MG tablet Take 1 tablet (375 mg total) by mouth 2 (two) times daily. 05/15/14   Delos Haring, PA-C  ondansetron (ZOFRAN ODT) 4 MG disintegrating tablet 33m ODT q4 hours prn nausea/vomit 08/28/17   BMalvin Johns MD  ondansetron (ZOFRAN) 4 MG tablet Take 1 tablet (4 mg total) by mouth every 6 (six) hours. 05/15/14   GDelos Haring PA-C  predniSONE (DELTASONE) 10 MG tablet Take 4 tablets (40 mg total) by mouth daily. 10/14/20   RQuintella Reichert MD      Allergies    Food    Review of Systems   Review of Systems  Physical Exam Updated Vital Signs BP (!) 140/83 (BP Location: Left Arm)   Pulse 70   Temp 98.5 F (36.9 C) (Oral)   Resp 17   Ht 5' 2"$  (1.575 m)   Wt 87.5 kg   LMP 01/27/2023   SpO2 100%   BMI 35.30 kg/m   Physical Exam Vitals and nursing note reviewed. Exam conducted with a chaperone present.  Constitutional:      General: She is not in acute distress.    Appearance: She is well-developed.  HENT:     Head: Normocephalic and atraumatic.     Right Ear: External ear normal.     Left Ear: External ear normal.     Nose: Nose normal.  Eyes:      Conjunctiva/sclera: Conjunctivae normal.  Cardiovascular:     Rate and Rhythm: Normal rate and regular rhythm.     Heart sounds: No murmur heard. Pulmonary:     Effort: No respiratory distress.     Breath sounds: No wheezing, rhonchi or rales.  Chest:     Comments: There is erythema to the anterior portion of the right breast around the nipple and areola.  Patient does have some nodularity below the nipple which is consistent with what is documented in previous exams, but no significant induration or fluctuance currently to suggest abscess. Abdominal:     Palpations: Abdomen is soft.     Tenderness: There is no abdominal tenderness. There is no guarding or rebound.  Musculoskeletal:     Cervical back: Normal range of motion and neck supple.     Right lower leg: No edema.     Left lower leg: No edema.  Skin:    General: Skin is warm and dry.     Findings: No rash.  Neurological:     General: No focal deficit present.     Mental Status: She is alert. Mental status is at baseline.     Motor: No weakness.  Psychiatric:        Mood and Affect: Mood normal.     ED Results / Procedures / Treatments   Labs (all labs ordered are listed, but only abnormal results are displayed) Labs Reviewed  CBC WITH DIFFERENTIAL/PLATELET - Abnormal; Notable for the following components:      Result Value   RBC 3.81 (*)    Hemoglobin 11.2 (*)    HCT 34.0 (*)    All other components within normal limits  BASIC METABOLIC PANEL - Abnormal; Notable for the following components:   Glucose, Bld 104 (*)    Calcium 8.5 (*)    All other components within normal limits    EKG None  Radiology No results found.  Procedures Procedures    Medications Ordered in ED Medications  cephALEXin (KEFLEX) capsule 500 mg (500 mg Oral Given 02/01/23 1030)  sulfamethoxazole-trimethoprim (BACTRIM DS) 800-160 MG per tablet 1 tablet (1 tablet Oral Given 02/01/23 1030)  oxyCODONE-acetaminophen (PERCOCET/ROXICET)  5-325 MG per tablet 1 tablet (1 tablet Oral Given 02/01/23 1100)    ED Course/ Medical Decision Making/ A&P    Patient seen and examined. History obtained directly from patient.   Labs/EKG: Ordered CBC, BMP.  Imaging: None ordered  Medications/Fluids: Ordered: P.o Bactrim and Keflex (reviewed UptoDate reccs for outpatient treatment of breast abscess).   Most recent vital signs reviewed and are as follows: BP (!) 140/83 (BP Location: Left Arm)   Pulse 70   Temp 98.5 F (36.9 C) (Oral)   Resp 17  Ht 5' 2"$  (1.575 m)   Wt 87.5 kg   LMP 01/27/2023   SpO2 100%   BMI 35.30 kg/m   Initial impression: Nonlactational mastitis  11:34 AM Reassessment performed. Patient appears stable, comfortable.  Labs personally reviewed and interpreted including: CBC with normal white blood cell count, mild anemia with hemoglobin 11.2 otherwise unremarkable; BMP with glucose 104 otherwise unremarkable.  Reviewed pertinent lab work and imaging with patient at bedside. Questions answered.   Most current vital signs reviewed and are as follows: BP (!) 140/83 (BP Location: Left Arm)   Pulse 70   Temp 98.5 F (36.9 C) (Oral)   Resp 17   Ht 5' 2"$  (1.575 m)   Wt 87.5 kg   LMP 01/27/2023   SpO2 100%   BMI 35.30 kg/m   Plan: Discharge to home.   Prescriptions written for: Keflex and Bactrim, oxycodone-acetaminophen # 8 tablets.  Patient counseled on use of narcotic pain medications. Counseled not to combine these medications with others containing tylenol. Urged not to drink alcohol, drive, or perform any other activities that requires focus while taking these medications. The patient verbalizes understanding and agrees with the plan.  Other home care instructions discussed: Warm compresses.    ED return instructions discussed: Pt urged to return with worsening pain, worsening swelling, expanding area of redness or streaking, fever, or any other concerns. Urged to take complete course of  antibiotics as prescribed. Counseled to take pain medications as prescribed. Pt verbalizes understanding and agrees with plan.  Follow-up instructions discussed: Patient encouraged to follow-up with their PCP or surgeon in 3-5 days for recheck of area to ensure appropriate resolution.                                Medical Decision Making Amount and/or Complexity of Data Reviewed Labs: ordered.  Risk Prescription drug management.   Patient with increasing redness and warmth of the right breast.  Patient was recently evaluated for breast cancer and had a negative mammogram and ultrasound.  She has been having chronic nipple inversion.  Her symptoms today appear to be more infectious in etiology.  I do not see a definite abscess.  She will be given a course of antibiotics and conservative measures with close follow-up.  No indications for admission today.  No indications for advanced imaging or surgical consultation.  The patient's vital signs, pertinent lab work and imaging were reviewed and interpreted as discussed in the ED course. Hospitalization was considered for further testing, treatments, or serial exams/observation. However as patient is well-appearing, has a stable exam, and reassuring studies today, I do not feel that they warrant admission at this time. This plan was discussed with the patient who verbalizes agreement and comfort with this plan and seems reliable and able to return to the Emergency Department with worsening or changing symptoms.          Final Clinical Impression(s) / ED Diagnoses Final diagnoses:  Cellulitis of right breast    Rx / DC Orders ED Discharge Orders          Ordered    sulfamethoxazole-trimethoprim (BACTRIM DS) 800-160 MG tablet  2 times daily        02/01/23 1133    cephALEXin (KEFLEX) 500 MG capsule  4 times daily        02/01/23 1133    oxyCODONE-acetaminophen (PERCOCET/ROXICET) 5-325 MG tablet  Every 6 hours PRN  02/01/23  1133              Carlisle Cater, PA-C 02/01/23 1137    Fredia Sorrow, MD 02/04/23 1409

## 2023-02-01 NOTE — ED Triage Notes (Signed)
Patient arrives ambulatory by POV stating she has been getting screened for breast cancer at [facility]. Patient c/o right breast tenderness, swelling and lump. Patients nipple is now inverted and feels like it is on fire.

## 2023-02-01 NOTE — Discharge Instructions (Addendum)
Please read and follow all provided instructions.  Your diagnoses today include:  1. Cellulitis of right breast     Tests performed today include: Vital signs. See below for your results today.   Medications prescribed:  Bactrim (trimethoprim/sulfamethoxazole) - antibiotic  You have been prescribed an antibiotic medicine: take the entire course of medicine even if you are feeling better. Stopping early can cause the antibiotic not to work.  Keflex (cephalexin) - antibiotic  You have been prescribed an antibiotic medicine: take the entire course of medicine even if you are feeling better. Stopping early can cause the antibiotic not to work.  Percocet (oxycodone/acetaminophen) - narcotic pain medication  DO NOT drive or perform any activities that require you to be awake and alert because this medicine can make you drowsy. BE VERY CAREFUL not to take multiple medicines containing Tylenol (also called acetaminophen). Doing so can lead to an overdose which can damage your liver and cause liver failure and possibly death.  Take any prescribed medications only as directed.   Home care instructions:  Follow any educational materials contained in this packet. Keep affected area above the level of your heart when possible. Wash area gently twice a day with warm soapy water. Do not apply alcohol or hydrogen peroxide. Cover the area if it draining or weeping.   Follow-up instructions: Please see your surgeon or primary care doctor in the next 3 to 5 days for recheck.  Return instructions:  Return to the Emergency Department if you have: Fever Worsening symptoms Worsening pain Worsening swelling Redness of the skin that moves away from the affected area, especially if it streaks away from the affected area  Any other emergent concerns  Your vital signs today were: BP (!) 140/83 (BP Location: Left Arm)   Pulse 70   Temp 98.5 F (36.9 C) (Oral)   Resp 17   Ht 5' 2"$  (1.575 m)   Wt 87.5  kg   LMP 01/27/2023   SpO2 100%   BMI 35.30 kg/m  If your blood pressure (BP) was elevated above 135/85 this visit, please have this repeated by your doctor within one month. --------------

## 2023-02-02 NOTE — ED Notes (Signed)
Pt called in about prescription and I let her speak to abigail P.A

## 2023-02-10 ENCOUNTER — Other Ambulatory Visit: Payer: Self-pay

## 2023-02-10 ENCOUNTER — Emergency Department (HOSPITAL_COMMUNITY)
Admission: EM | Admit: 2023-02-10 | Discharge: 2023-02-10 | Discharge status: Against Medical Advice | Payer: Medicaid Other | Attending: Student | Admitting: Student

## 2023-02-10 DIAGNOSIS — Z5321 Procedure and treatment not carried out due to patient leaving prior to being seen by health care provider: Secondary | ICD-10-CM | POA: Insufficient documentation

## 2023-02-10 DIAGNOSIS — N644 Mastodynia: Secondary | ICD-10-CM | POA: Insufficient documentation

## 2023-02-10 LAB — CBC WITH DIFFERENTIAL/PLATELET
Abs Immature Granulocytes: 0.02 10*3/uL (ref 0.00–0.07)
Basophils Absolute: 0 10*3/uL (ref 0.0–0.1)
Basophils Relative: 0 %
Eosinophils Absolute: 0.3 10*3/uL (ref 0.0–0.5)
Eosinophils Relative: 4 %
HCT: 34.8 % — ABNORMAL LOW (ref 36.0–46.0)
Hemoglobin: 11.9 g/dL — ABNORMAL LOW (ref 12.0–15.0)
Immature Granulocytes: 0 %
Lymphocytes Relative: 29 %
Lymphs Abs: 2 10*3/uL (ref 0.7–4.0)
MCH: 30.4 pg (ref 26.0–34.0)
MCHC: 34.2 g/dL (ref 30.0–36.0)
MCV: 89 fL (ref 80.0–100.0)
Monocytes Absolute: 0.5 10*3/uL (ref 0.1–1.0)
Monocytes Relative: 7 %
Neutro Abs: 4 10*3/uL (ref 1.7–7.7)
Neutrophils Relative %: 60 %
Platelets: 283 10*3/uL (ref 150–400)
RBC: 3.91 MIL/uL (ref 3.87–5.11)
RDW: 12.9 % (ref 11.5–15.5)
WBC: 6.9 10*3/uL (ref 4.0–10.5)
nRBC: 0 % (ref 0.0–0.2)

## 2023-02-10 LAB — BASIC METABOLIC PANEL
Anion gap: 8 (ref 5–15)
BUN: 8 mg/dL (ref 6–20)
CO2: 23 mmol/L (ref 22–32)
Calcium: 8.8 mg/dL — ABNORMAL LOW (ref 8.9–10.3)
Chloride: 104 mmol/L (ref 98–111)
Creatinine, Ser: 0.95 mg/dL (ref 0.44–1.00)
GFR, Estimated: >60 mL/min (ref >60)
Glucose, Bld: 105 mg/dL — ABNORMAL HIGH (ref 70–99)
Potassium: 4 mmol/L (ref 3.5–5.1)
Sodium: 135 mmol/L (ref 135–145)

## 2023-02-10 LAB — I-STAT BETA HCG BLOOD, ED (MC, WL, AP ONLY): I-stat hCG, quantitative: <5 m[IU]/mL (ref <5)

## 2023-02-10 NOTE — ED Notes (Signed)
Called for room 2x. No response

## 2023-02-10 NOTE — ED Provider Triage Note (Signed)
Emergency Medicine Provider Triage Evaluation Note  Tami Evans , a 37 y.o. female  was evaluated in triage.  Patient here presenting with the concern of breast pain.  She says it has been ongoing for multiple weeks.  She was seen and had a mammogram and ultrasound that showed that she had "increased fatty tissue".  She then came to the emergency department 10 days ago and was prescribed antibiotics.  She says she continues to have pain and feels a lump just under her areola.  Also says that her nipple occasionally inverts.  She was supposed to see a breast surgeon but something happened with her health insurance and she has been unable to.  Review of Systems  Positive:  Negative:   Physical Exam  BP 121/73 (BP Location: Right Arm)   Pulse 71   Temp 99 F (37.2 C)   Resp 18   LMP 01/27/2023   SpO2 100%  Gen:   Awake, no distress   Resp:  Normal effort  MSK:   Moves extremities without difficulty  Other:  Breast exam performed in presence of RN.  Patient does not appear to have cellulitis.  Some mild warmth and induration noted just under the right nipple which is inverted.  Medical Decision Making  Medically screening exam initiated at 3:26 PM.  Appropriate orders placed.  Deysy Furmanski was informed that the remainder of the evaluation will be completed by another provider, this initial triage assessment does not replace that evaluation, and the importance of remaining in the ED until their evaluation is complete.    Will compare basic labs however ultimately she will likely need to follow-up outpatient   Rhae Hammock, PA-C 02/10/23 1536

## 2023-02-10 NOTE — ED Triage Notes (Signed)
Pt arrives for further eval of continued R breast swelling and pain. Seen for same and given abx ten days ago which she has completed without improvement.

## 2023-02-25 ENCOUNTER — Encounter: Payer: Self-pay | Admitting: Plastic Surgery

## 2023-02-25 ENCOUNTER — Ambulatory Visit: Payer: 59 | Admitting: Plastic Surgery

## 2023-02-25 VITALS — BP 115/73 | HR 95 | Ht 62.0 in | Wt 215.2 lb

## 2023-02-25 DIAGNOSIS — N6011 Diffuse cystic mastopathy of right breast: Secondary | ICD-10-CM

## 2023-02-25 DIAGNOSIS — N62 Hypertrophy of breast: Secondary | ICD-10-CM
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
## 2023-03-23 ENCOUNTER — Institutional Professional Consult (permissible substitution): Payer: 59 | Admitting: Plastic Surgery

## 2023-04-01 ENCOUNTER — Ambulatory Visit (INDEPENDENT_AMBULATORY_CARE_PROVIDER_SITE_OTHER): Payer: Commercial Managed Care - HMO | Admitting: Plastic Surgery

## 2023-04-01 ENCOUNTER — Encounter: Payer: Self-pay | Admitting: Plastic Surgery

## 2023-04-01 DIAGNOSIS — N62 Hypertrophy of breast: Secondary | ICD-10-CM | POA: Diagnosis not present

## 2023-04-01 NOTE — Progress Notes (Signed)
Tami Evans returns today for evaluation.  She states that she has not had any further issues with the mastitis in her right breast and feels good.  She denies any fevers chills drainage from her nipple.  She states that the firmness that was in the right breast has completely resolved.   On examination there are no masses in the right breast and no erythema.  Will schedule her for her breast reduction.  And proceed based on scheduling.

## 2023-04-13 ENCOUNTER — Telehealth: Payer: Self-pay | Admitting: Plastic Surgery

## 2023-04-13 NOTE — Telephone Encounter (Signed)
Pt called to inform provider of her employer requesting a date for sx so he can find a replacement. Informed pt of typical timeline. Pt request a call when her case is submitted to ins. Pt phone number [phone]

## 2023-04-16 ENCOUNTER — Telehealth: Payer: Self-pay | Admitting: *Deleted

## 2023-04-16 NOTE — Telephone Encounter (Signed)
[employer] denied [phone] stating it is an excluded benefit. Patient notified via MyChart message.

## 2023-12-23 ENCOUNTER — Ambulatory Visit: Payer: MEDICAID | Admitting: Plastic Surgery

## 2023-12-23 VITALS — BP 123/68 | HR 87 | Ht 62.0 in | Wt 215.8 lb

## 2023-12-23 DIAGNOSIS — N62 Hypertrophy of breast: Secondary | ICD-10-CM

## 2023-12-23 DIAGNOSIS — N6489 Other specified disorders of breast: Secondary | ICD-10-CM

## 2023-12-23 NOTE — Progress Notes (Signed)
 Tami Evans returns today for reevaluation.  I initially saw her March 2024 at which time she complained of significant breast asymmetry making it difficult for her to wear as well as the large size of her breast contributing to upper back and neck pain.  She also had a history of mastitis in the past.  She was submitted for a bilateral breast reduction which was unfortunately denied.    Since her last appointment with me the patient has seen a breast surgeon, Dr. Kirt, for a right breast mass.  She underwent excision of the mass which returned as benign tissue consistent with a previous abscess cavity.  The patient initially had some bloody nipple discharge which was felt to be secondary to her surgery.  Since that time she has been doing well though she does note some dryness of the right areola around the incision.  On examination the patient still has a significant asymmetry with greater than 1 cup size difference.  The right breast is quite heavy and she likely would benefit from a bilateral breast reduction for achieving symmetry and reducing the weight in the right breast.  She also has a palpable mass at the area of her recent excision.  We discussed breast reductions again.  I do not think that she should undergo breast reduction for at least 6 months after the breast lumpectomy.  I have talked to her about massage of the scar and the scar within the breast.  She understands all of this.  We will schedule her for a follow-up appointment in May.  20 minutes spent reviewing chart, examining patient, discussing management, documenting.

## 2024-04-25 ENCOUNTER — Ambulatory Visit: Payer: MEDICAID | Admitting: Surgical

## 2024-04-26 ENCOUNTER — Encounter: Payer: Self-pay | Admitting: Plastic Surgery

## 2024-05-02 ENCOUNTER — Encounter: Payer: Self-pay | Admitting: Plastic Surgery

## 2024-05-02 ENCOUNTER — Ambulatory Visit: Payer: MEDICAID | Admitting: Plastic Surgery

## 2024-05-02 VITALS — BP 123/84 | HR 65 | Ht 62.0 in | Wt 211.2 lb

## 2024-05-02 DIAGNOSIS — N6489 Other specified disorders of breast: Secondary | ICD-10-CM

## 2024-05-02 DIAGNOSIS — M546 Pain in thoracic spine: Secondary | ICD-10-CM | POA: Diagnosis not present

## 2024-05-02 DIAGNOSIS — N62 Hypertrophy of breast: Secondary | ICD-10-CM | POA: Diagnosis not present

## 2024-05-02 NOTE — Progress Notes (Signed)
 Tami Evans returns today for further discussion of a bilateral breast reduction.  She has had no further issues with drainage from the right breast or nipple or any symptoms of mastitis.  She would like to proceed with a breast reduction.  She does endorse continued upper back and neck pain.  She still has difficulty with proper wearing and fitting of bras.  She request that she be "as small as possible".  On physical exam the incision in the right areola is well-healed though there is still some firm breast tissue in this area.  No other dominant masses are appreciated.  She does have marked asymmetry of the breast.  Her sternal notch nipple distance on the right is 40 cm and 37 cm on the left her nipple to fold distance on the right is 22 cm and 20 cm on the left.  We reviewed the location of the incisions and the unpredictable nature of scarring and wound healing.  We discussed the risks of bleeding, infection, and seroma formation.  We discussed the risk of nipple loss due to nipple ischemia which is slightly higher due to her previous surgeries on her nipple and areola complex.  She will have drains postoperatively.  These should be removed shortly after surgery.  I have asked her to refrain from heavy lifting and vigorous activity as well as submerging the incisions underwater for 6 weeks.  She may return to light activity as soon as she desires.  She will be requested to begin ambulating as soon as possible after surgery to help decrease the risk of DVT.  All questions were answered to her satisfaction.  Photographs were obtained today with her consent.  Will submit her for a bilateral breast reduction at her request.    I can remove I can remove 800 g on the right and 700 g on the left  Will need to obtain her pathology and mammogram results.  30 minutes was spent reviewing the patient's chart, examining the patient, discussing the procedure, coordinating care, and documenting.

## 2024-07-04 ENCOUNTER — Encounter: Payer: MEDICAID | Admitting: Surgical

## 2024-07-22 ENCOUNTER — Encounter (HOSPITAL_BASED_OUTPATIENT_CLINIC_OR_DEPARTMENT_OTHER): Payer: Self-pay

## 2024-07-22 ENCOUNTER — Ambulatory Visit (HOSPITAL_BASED_OUTPATIENT_CLINIC_OR_DEPARTMENT_OTHER): Admit: 2024-07-22 | Payer: MEDICAID | Admitting: Plastic Surgery

## 2024-07-22 SURGERY — MAMMOPLASTY, REDUCTION
Anesthesia: Choice | Laterality: Bilateral

## 2024-07-25 ENCOUNTER — Encounter: Payer: MEDICAID | Admitting: Surgical

## 2024-07-28 ENCOUNTER — Encounter: Payer: MEDICAID | Admitting: Plastic Surgery
